# Patient Record
Sex: Male | Born: 1970 | Race: White | Hispanic: No | Marital: Married | Smoking: Never smoker
Health system: Southern US, Community
[De-identification: ages and names within clinical notes are randomized; demographics above are authoritative.]

## PROBLEM LIST (undated history)

## (undated) HISTORY — PX: SPINE SURGERY: SHX786

## (undated) HISTORY — PX: MICRODISCECTOMY LUMBAR: SUR864

---

## 2006-04-21 ENCOUNTER — Ambulatory Visit (HOSPITAL_COMMUNITY): Admission: RE | Admit: 2006-04-21 | Discharge: 2006-04-22 | Payer: Self-pay | Admitting: Specialist

## 2008-07-12 IMAGING — CR DG LUMBAR SPINE 2-3V
3 series · 3 of 3 positions shown · non-contrast
Comparison: none

CLINICAL DATA: 36-year-old male; pre-op for spine decompression.
 LUMBAR SPINE ? 2 VIEW ? 04/21/06:

[t l-spine a.p.]
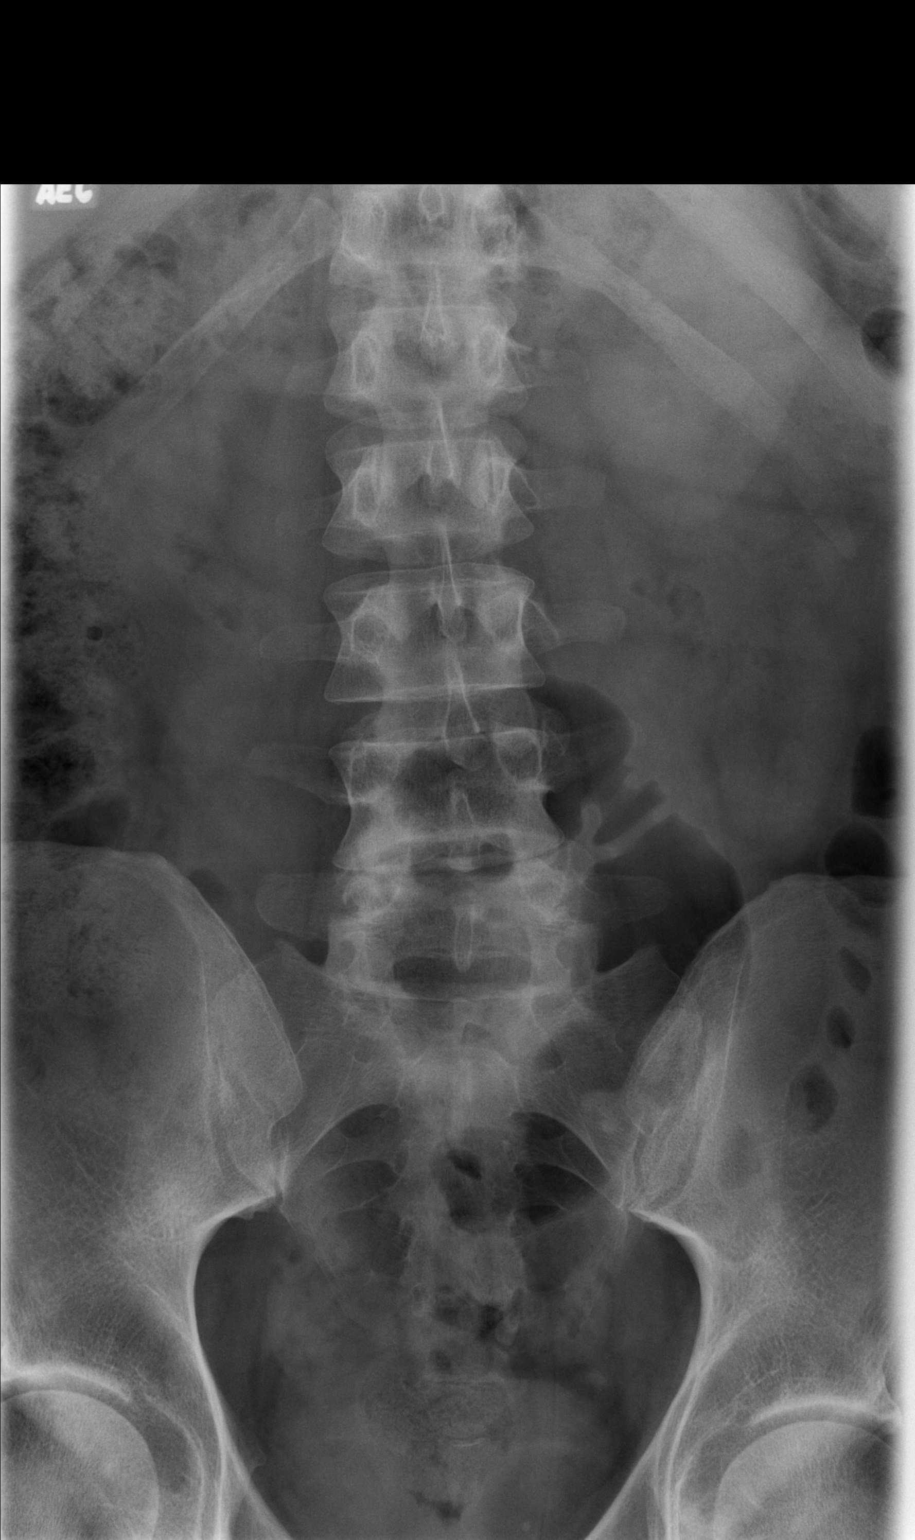

[t l-spine lat]
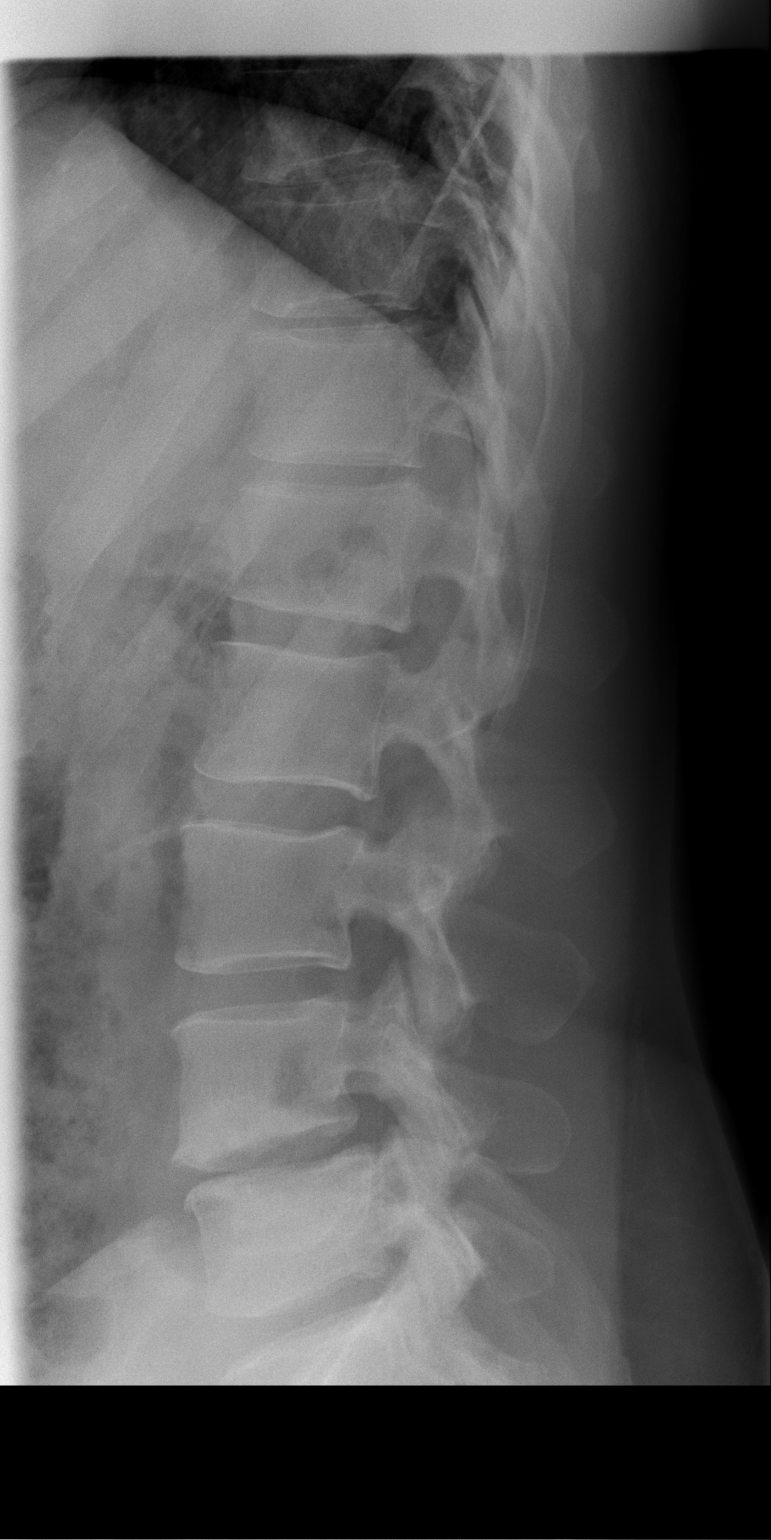

[t l-spine l5-s1 spot]
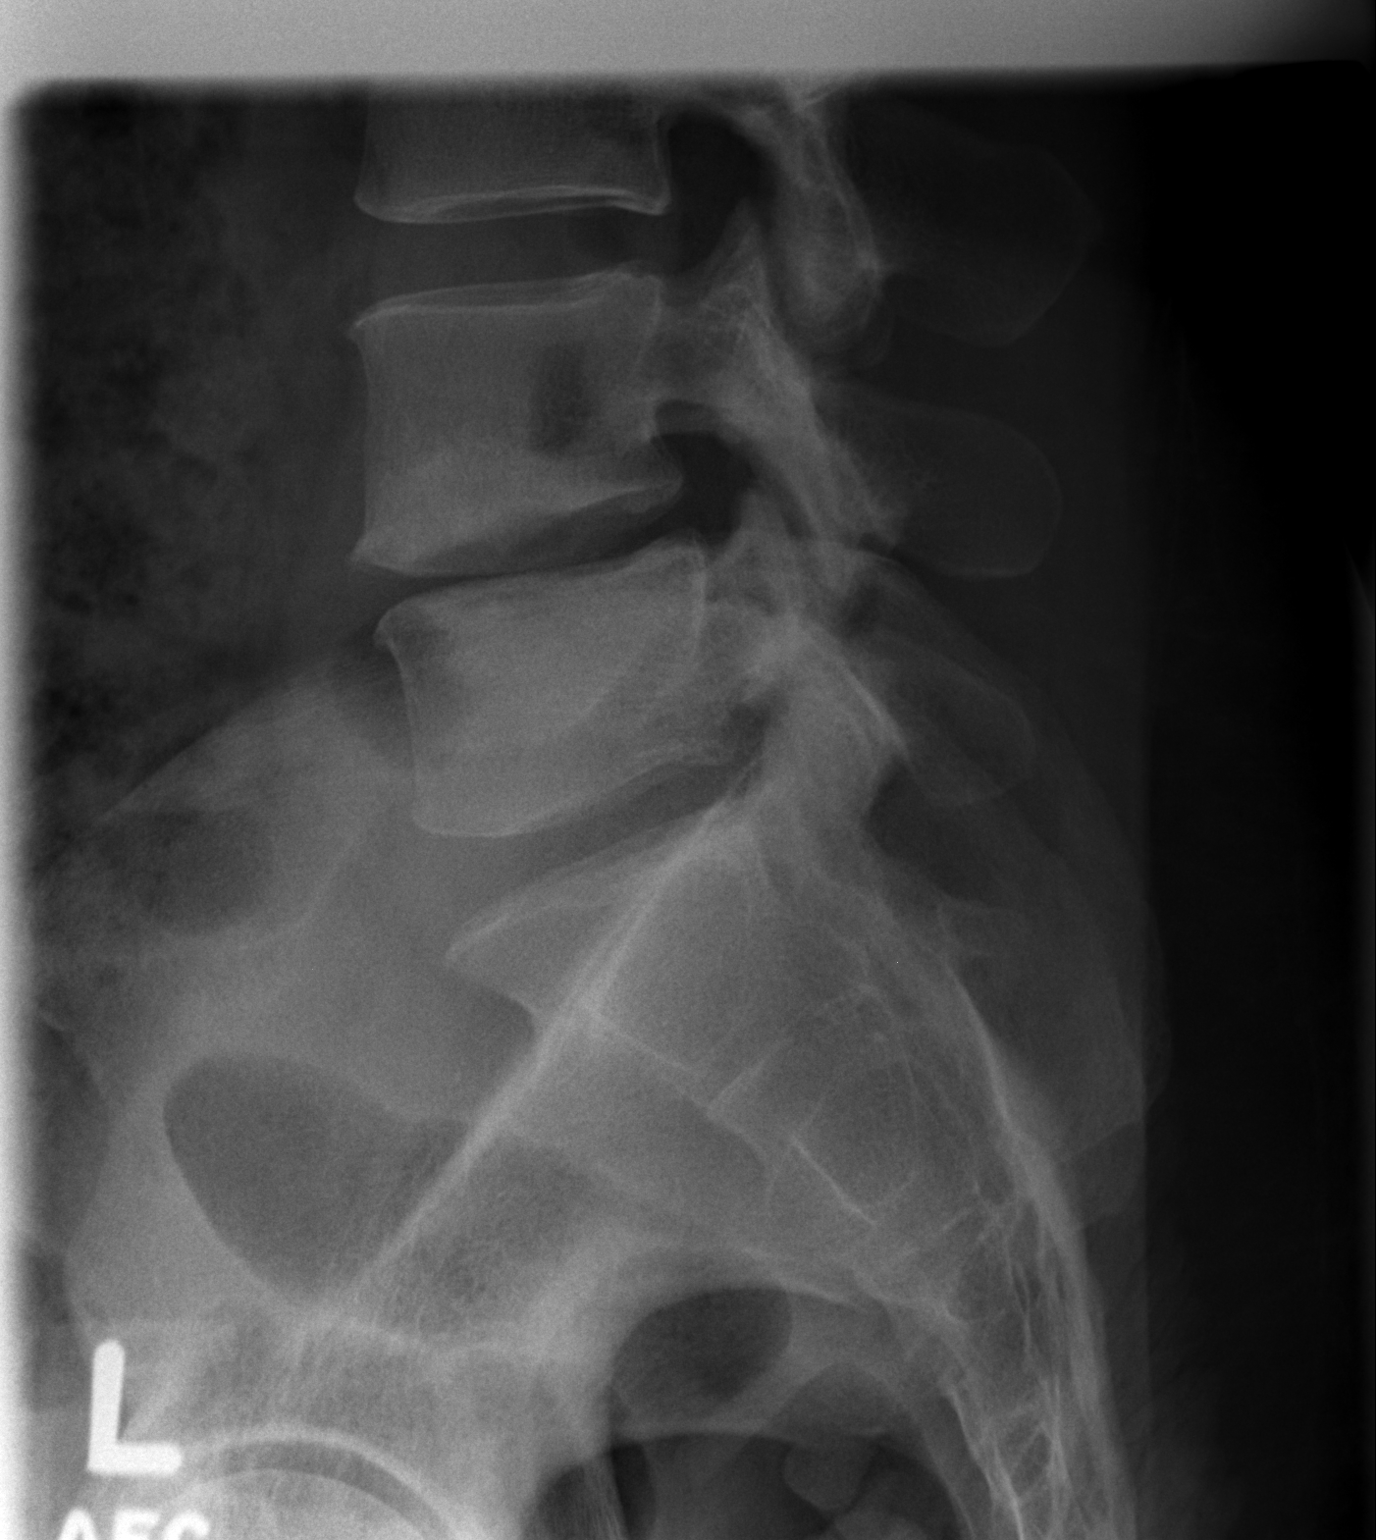

[3 of 3 positions shown; findings below may reference images not displayed]

FINDINGS: Five non-rib-bearing lumbar type vertebral bodies are present.  There is loss of disk height and end-plate reactive change at L4-5.  Alignment is maintained. There is some loss of disk height at L5-S1.
IMPRESSION: 1.  Degenerative disk disease is evident at L4-5 and L5-S1.
 2.  No evidence of acute fracture or subluxation.

## 2013-04-10 ENCOUNTER — Ambulatory Visit: Payer: Managed Care, Other (non HMO) | Admitting: Internal Medicine

## 2013-04-10 VITALS — BP 126/72 | HR 63 | Temp 97.9°F | Resp 18 | Ht 73.0 in | Wt 220.0 lb

## 2013-04-10 DIAGNOSIS — M545 Low back pain, unspecified: Secondary | ICD-10-CM

## 2013-04-10 MED ORDER — PIROXICAM 20 MG PO CAPS
20.0000 mg | ORAL_CAPSULE | Freq: Every day | ORAL | Status: AC
Start: 2013-04-10 — End: ?

## 2013-04-10 MED ORDER — METAXALONE 800 MG PO TABS: 800.0000 mg | ORAL_TABLET | Freq: Three times a day (TID) | ORAL | Status: AC

## 2013-04-10 NOTE — Progress Notes (Signed)
Subjective:   This chart was scribed for Ellamae Sia, MD by Arlan Organ, [facility] Scribe. This patient was seen in room 5 and the patient's care was started 9:55 AM.    Patient ID: Richard Bryan, male    DOB: [birth year], 43 y.o.   MRN: [id number]  Chief Complaint  Patient presents with  . Back Pain    has hx of spasms     HPI  HPI Comments: Richard Bryan is a 43 y.o. male who presents to [facility] complaining of constant non-radiating lower back pain x 1 week that is unchanged. He describes this pain as "tight" and "sore". He states this pain is exacerbated by bending over at the waist and standing or sitting for long periods of time. Pt states he has driven about 800 miles in the last week and associates his current pain with his long distance travel. At this time he denies any weakness or numbness. Pt reports a PSHx of back surgery between L4 and L5 a number of years ago. He has no other pertinent medical history. No other concerns this visit.  History reviewed. No pertinent past medical history.  History   Social History  . Marital Status: Married    Spouse Name: N/A    Number of Children: N/A  . Years of Education: N/A   Occupational History  . Not on file.   Social History Main Topics  . Smoking status: Never Smoker   . Smokeless tobacco: Not on file  . Alcohol Use: Yes  . Drug Use: No  . Sexual Activity: Not on file   Other Topics Concern  . Not on file   Social History Narrative  . No narrative on file    Review of Systems  Constitutional: Negative for fever and chills.  HENT: Negative for congestion.   Eyes: Negative for redness.  Respiratory: Negative for cough.   Musculoskeletal: Positive for back pain.  Skin: Negative for rash.  Psychiatric/Behavioral: Negative for confusion.    Triage Vitals: BP 126/72  Pulse 63  Temp(Src) 97.9 F (36.6 C) (Oral)  Resp 18  Ht 6\' 1"  (1.854 m)  Wt 220 lb (99.791 kg)   BMI 29.03 kg/m2  SpO2 100%   Objective:   Physical Exam  Nursing note and vitals reviewed. Constitutional: He is oriented to person, place, and time. He appears well-developed and well-nourished.  HENT:  Head: Normocephalic and atraumatic.  Eyes: EOM are normal.  Neck: Normal range of motion.  Cardiovascular: Normal rate.   Pulmonary/Chest: Effort normal.  Musculoskeletal: Normal range of motion. He exhibits tenderness.  Tender generally over mid lumbar area Pain increased with flexion Straight leg raise is negative to 90 degrees bilaterally  Neurological: He is alert and oriented to person, place, and time.  Skin: Skin is warm and dry.  Psychiatric: He has a normal mood and affect. His behavior is normal.          Assessment & Plan:   I personally performed the services described in this documentation, which was scribed in my presence. The recorded information has been reviewed and is accurate.  LBP (low back pain) will give usual regimen Meds ordered this encounter  Medications  . Naproxen Sodium (ALEVE PO)    Sig: Take by mouth.  . piroxicam (FELDENE) 20 MG capsule    Sig: Take 1 capsule (20 mg total) by mouth daily.    Dispense:  30 capsule    Refill:  0  . metaxalone (SKELAXIN) 800 MG tablet    Sig: Take 1 tablet (800 mg total) by mouth 3 (three) times daily.    Dispense:  30 tablet    Refill:  0   F/u 6 weeks
